# Patient Record
Sex: Male | Born: 1980 | Hispanic: No | Marital: Single
Health system: Southern US, Community
[De-identification: ages and names within clinical notes are randomized; demographics above are authoritative.]

---

## 2014-01-27 ENCOUNTER — Ambulatory Visit
Admission: RE | Admit: 2014-01-27 | Discharge: 2014-01-27 | Disposition: A | Discharge status: Home / Self Care | Payer: No Typology Code available for payment source | Source: Ambulatory Visit | Attending: Occupational Medicine | Admitting: Occupational Medicine

## 2014-01-27 ENCOUNTER — Other Ambulatory Visit: Payer: Self-pay | Admitting: Occupational Medicine

## 2014-01-27 DIAGNOSIS — Z021 Encounter for pre-employment examination: Secondary | ICD-10-CM

## 2014-03-23 ENCOUNTER — Ambulatory Visit: Payer: Self-pay

## 2014-03-23 ENCOUNTER — Other Ambulatory Visit: Payer: Self-pay | Admitting: Occupational Medicine

## 2014-03-23 DIAGNOSIS — M79671 Pain in right foot: Secondary | ICD-10-CM

## 2015-06-13 IMAGING — CR DG FOOT COMPLETE 3+V*R*
3 series · 3 of 3 positions shown · non-contrast
Comparison: No priors.

CLINICAL DATA: Right-sided foot pain.

EXAM:
RIGHT FOOT COMPLETE - 3+ VIEW

[view not recorded (1 of 3)]
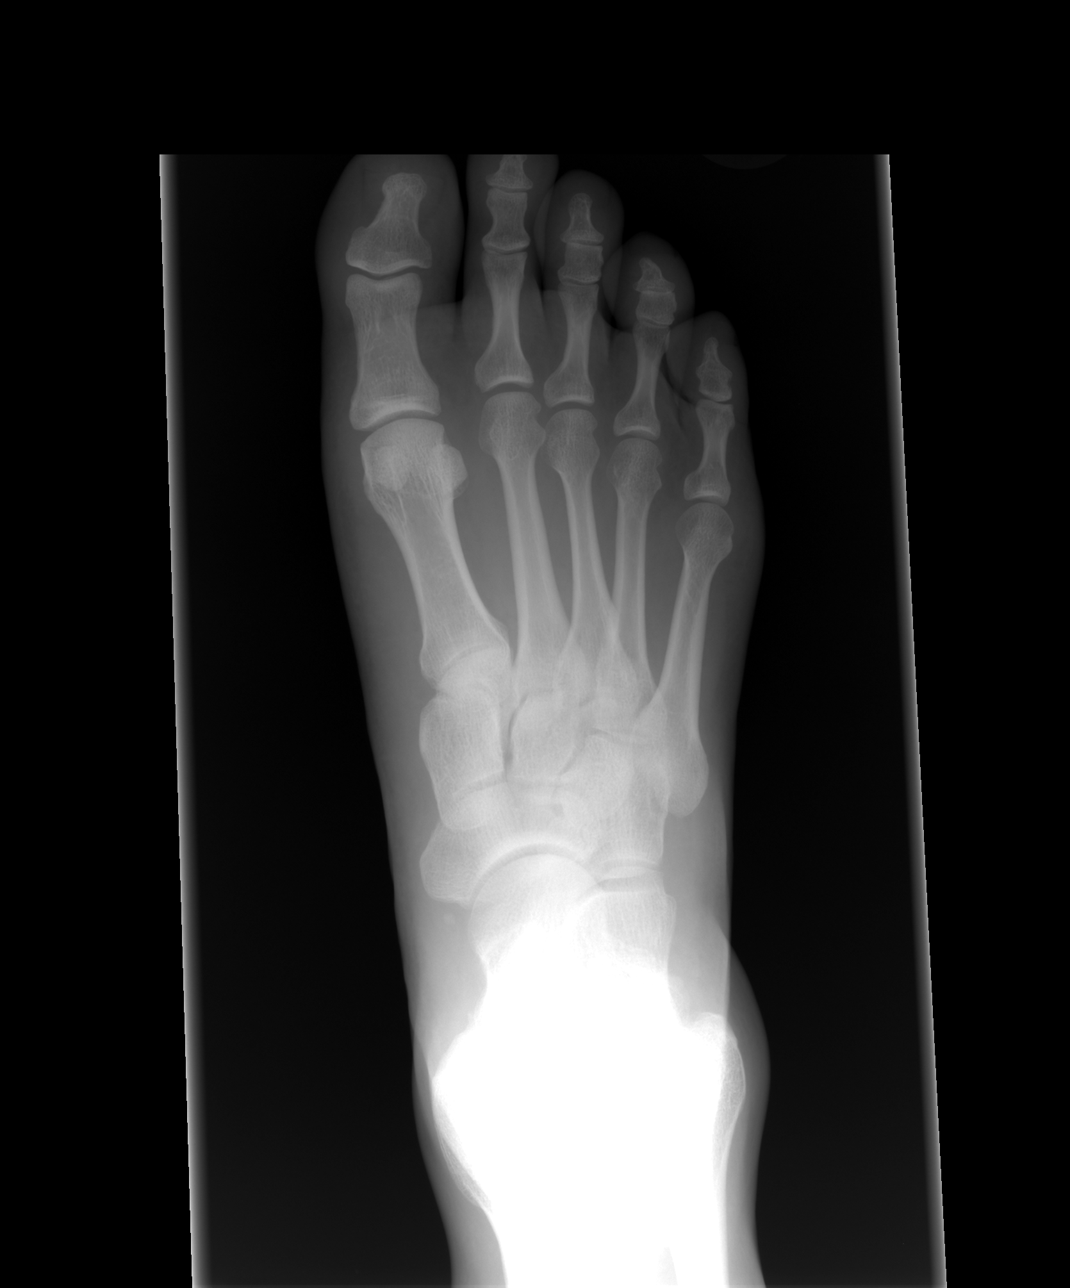

[view not recorded (2 of 3)]
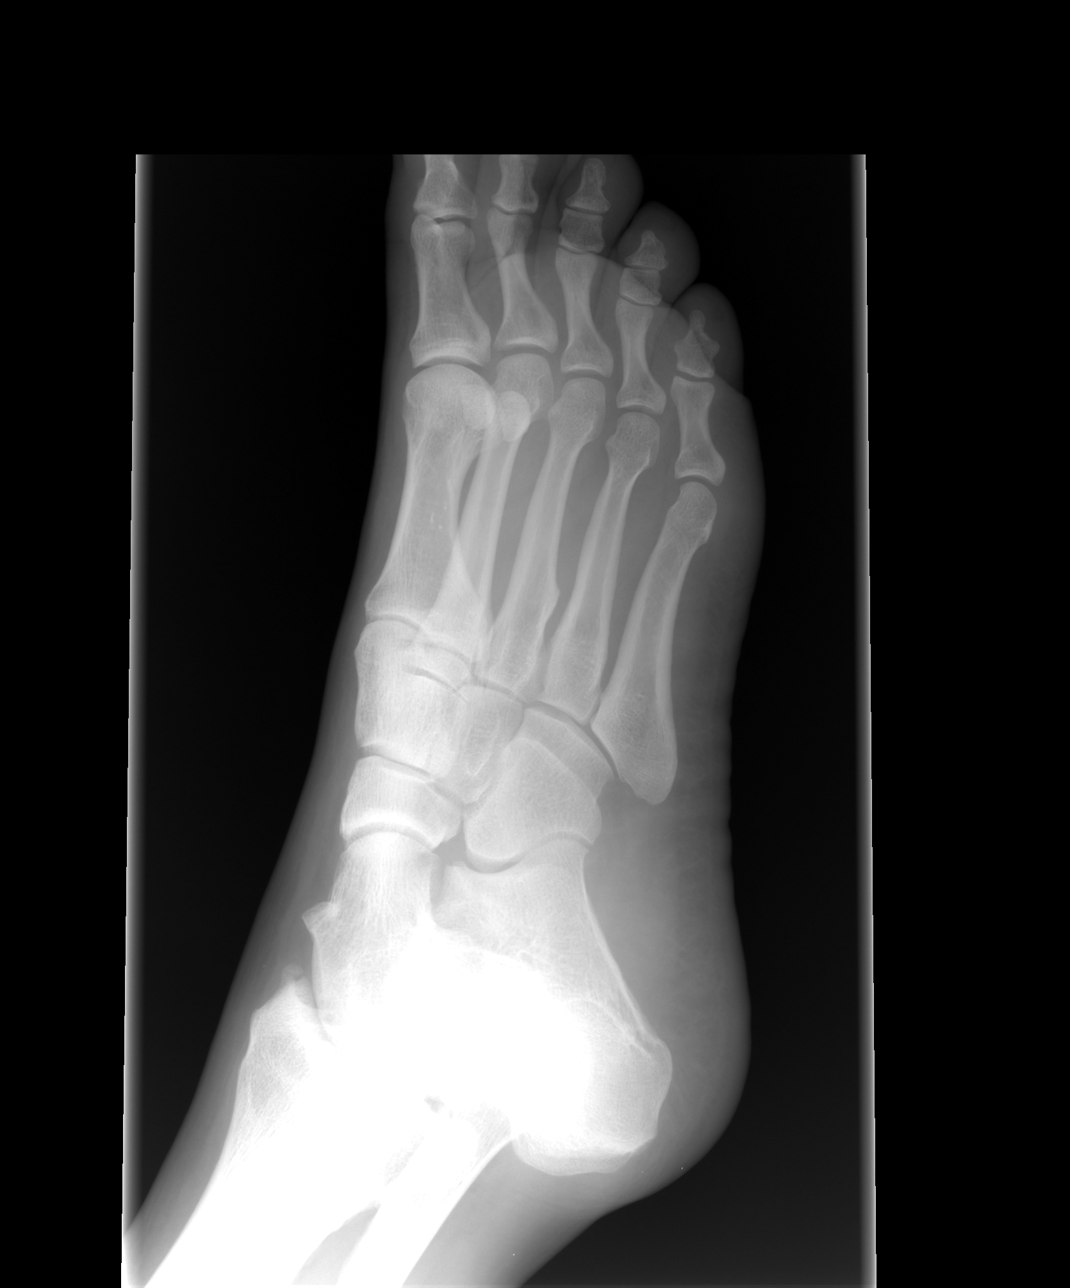

[view not recorded (3 of 3)]
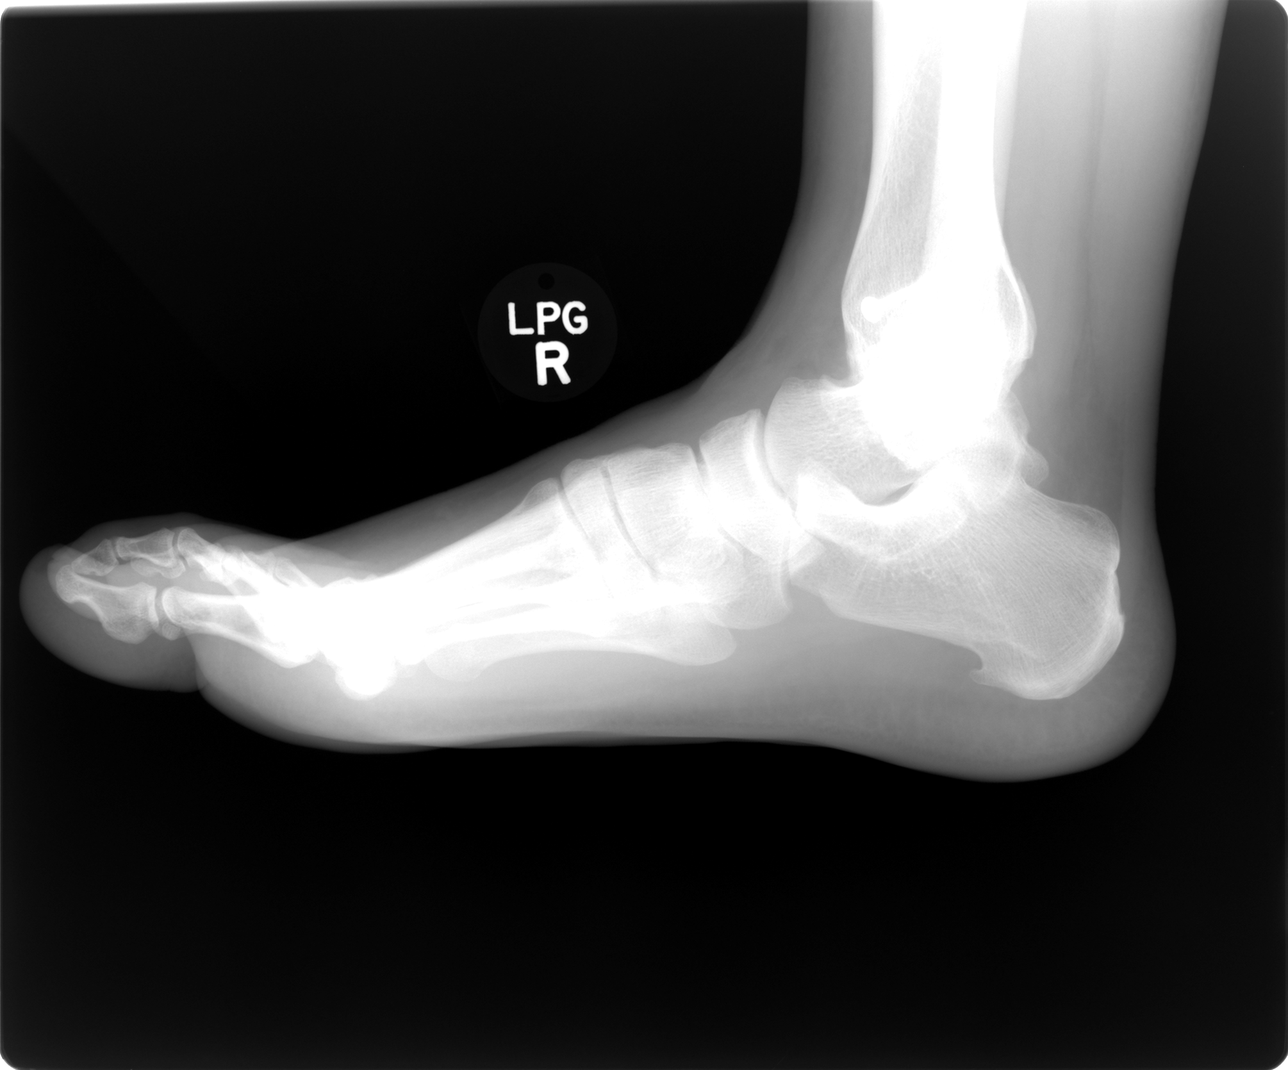

[3 of 3 positions shown; findings below may reference images not displayed]

FINDINGS: Three views of the right foot demonstrate no acute displaced
fracture, subluxation or dislocation. A small plantar calcaneal spur
is noted. Degenerative changes of osteoarthritis at the tibiotalar
joint are noted. A fixation screw is present in the distal tibia.
IMPRESSION: 1. No acute radiographic abnormality of the right foot.
2. Tibiotalar joint osteoarthritis.
3. Small plantar calcaneal spur.

## 2024-11-20 ENCOUNTER — Ambulatory Visit

## 2024-11-20 ENCOUNTER — Encounter: Payer: Self-pay | Admitting: Podiatry

## 2024-11-20 ENCOUNTER — Ambulatory Visit: Admitting: Podiatry

## 2024-11-20 DIAGNOSIS — M722 Plantar fascial fibromatosis: Secondary | ICD-10-CM

## 2024-11-20 DIAGNOSIS — M79672 Pain in left foot: Secondary | ICD-10-CM

## 2024-11-20 DIAGNOSIS — M7672 Peroneal tendinitis, left leg: Secondary | ICD-10-CM

## 2024-11-20 NOTE — Patient Instructions (Signed)

## 2024-11-24 ENCOUNTER — Telehealth: Payer: Self-pay

## 2024-11-24 MED ORDER — CELECOXIB 200 MG PO CAPS: 200.0000 mg | ORAL_CAPSULE | Freq: Two times a day (BID) | ORAL | 0 refills | Status: DC

## 2024-11-24 NOTE — Telephone Encounter (Signed)
 Patient called to request a prescription for Celebrex .

## 2024-11-24 NOTE — Progress Notes (Signed)
 Subjective:  Patient ID: Joseph Shah, male    DOB: [birth year],  MRN: [id number]  Chief Complaint  Patient presents with   Foot Pain    Pt is here due to left foot pain, he states that in 2018 he torn his fascia and had surgery, states since then he has had maybe 2 injections into the foot since then, states the pain is different but at the heel of the foot, states he wore boot and it help relieve some of the pain, no recent injuries to the foot that would aggravate it.    Discussed the use of AI scribe software for clinical note transcription with the patient, who gave verbal consent to proceed.  History of Present Illness Joseph Shah is a 43 year old male who presents with left ankle and foot pain following prior plantar fascia release surgery.  He has been experiencing left ankle pain and previously had a plantar fascia release surgery on the left foot in 2018. The pain initially started in the ankle and was exacerbated during a vacation in [place]. It subsides after walking for a while but returns after sleeping. The pain is described as throbbing on the left side where the plantar fascia was released, radiating up into the ankle.  He has been using a night splint, which has alleviated the ankle pain, but there is still tenderness on the lateral side of the plantar fascia. No pain in the back of the Achilles tendon, and no burning or pulling sensations up the ankle. The pain is described as 'tender' and more tender than he could assess himself.  He has a history of a shoulder injury, having broken his shoulder in February 2028, and is currently taking Flexeril for this condition. He is also taking Celebrex , 200 mg once at night, for arthritis-related symptoms. He denies taking any other pain medications or anti-inflammatories.  He is currently on a weight loss medication, a GLP-1 receptor agonist, which he refers to as Zepbound. He denies any history of gout and reports no episodes of hot, red,  or swollen joints.      Objective:    Physical Exam VASCULAR: DP and PT pulse palpable. Foot is warm and well-perfused. Capillary fill time is brisk. DERMATOLOGIC: Normal skin turgor, texture, and temperature. No open lesions, rashes, or ulcerations. NEUROLOGIC: Normal sensation to light touch and pressure. No paresthesias. ORTHOPEDIC: Pain on palpation of peroneus brevis in retromalleolar groove, radiating distally and proximally. Mild tenderness on palpation of lateral band of plantar fascia, inferior and lateral. No pain with lateral compression.     No images are attached to the encounter.    Results RADIOLOGY Foot X-ray: Moderate heel spur, no fracture, stress fracture, or significant arthritic changes in the hindfoot (11/20/2024)   Assessment:   1. Peroneal tendinitis, left   2. Plantar fasciitis of left foot      Plan:  Patient was evaluated and treated and all questions answered.  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 Pain radiates from the peroneus brevis in the retromalleolar groove distally and proximally. Mild tenderness on the lateral band of the plantar fascia, inferior and lateral. Radiographs show moderate heel spur, not the source of pain. Differential includes plantar fasciitis, but tenderness is more consistent with peroneal tendinopathy. Injection around peroneal tendons is not recommended due to risk of inflammation and partial tearing, which could worsen with injection. -  Administered injection in the lateral band of the plantar fascia. - Initiated physical therapy exercises. - Continue use of night splint. - Increase Celebrex  to twice daily.      No follow-ups on file.

## 2024-12-22 ENCOUNTER — Other Ambulatory Visit: Payer: Self-pay | Admitting: Podiatry

## 2025-01-22 ENCOUNTER — Ambulatory Visit: Admitting: Podiatry
